# Patient Record
Sex: Female | Born: 1950 | Race: Black or African American | Hispanic: No
Health system: Southern US, Community
[De-identification: ages and names within clinical notes are randomized; demographics above are authoritative.]

---

## 2016-04-18 ENCOUNTER — Encounter (HOSPITAL_COMMUNITY): Payer: Self-pay | Admitting: *Deleted

## 2016-04-18 ENCOUNTER — Emergency Department (HOSPITAL_COMMUNITY)
Admission: EM | Admit: 2016-04-18 | Discharge: 2016-04-18 | Disposition: A | Discharge status: Home / Self Care | Payer: Worker's Compensation | Attending: Emergency Medicine | Admitting: Emergency Medicine

## 2016-04-18 ENCOUNTER — Emergency Department (HOSPITAL_COMMUNITY): Payer: Worker's Compensation

## 2016-04-18 DIAGNOSIS — Z792 Long term (current) use of antibiotics: Secondary | ICD-10-CM | POA: Diagnosis not present

## 2016-04-18 DIAGNOSIS — S3991XA Unspecified injury of abdomen, initial encounter: Secondary | ICD-10-CM | POA: Diagnosis present

## 2016-04-18 DIAGNOSIS — Y998 Other external cause status: Secondary | ICD-10-CM | POA: Diagnosis not present

## 2016-04-18 DIAGNOSIS — I6502 Occlusion and stenosis of left vertebral artery: Secondary | ICD-10-CM | POA: Insufficient documentation

## 2016-04-18 DIAGNOSIS — S301XXA Contusion of abdominal wall, initial encounter: Secondary | ICD-10-CM | POA: Diagnosis not present

## 2016-04-18 DIAGNOSIS — Y92410 Unspecified street and highway as the place of occurrence of the external cause: Secondary | ICD-10-CM | POA: Insufficient documentation

## 2016-04-18 DIAGNOSIS — Z79899 Other long term (current) drug therapy: Secondary | ICD-10-CM | POA: Diagnosis not present

## 2016-04-18 DIAGNOSIS — Z3202 Encounter for pregnancy test, result negative: Secondary | ICD-10-CM | POA: Insufficient documentation

## 2016-04-18 DIAGNOSIS — Y9389 Activity, other specified: Secondary | ICD-10-CM | POA: Diagnosis not present

## 2016-04-18 DIAGNOSIS — T1490XA Injury, unspecified, initial encounter: Secondary | ICD-10-CM

## 2016-04-18 LAB — CBC WITH DIFFERENTIAL/PLATELET
BASOS ABS: 0 10*3/uL (ref 0.0–0.1)
BASOS PCT: 0 %
EOS PCT: 0 %
Eosinophils Absolute: 0 10*3/uL (ref 0.0–0.7)
HCT: 41.1 % (ref 36.0–46.0)
Hemoglobin: 13.9 g/dL (ref 12.0–15.0)
LYMPHS PCT: 14 %
Lymphs Abs: 1.3 10*3/uL (ref 0.7–4.0)
MCH: 33.2 pg (ref 26.0–34.0)
MCHC: 33.8 g/dL (ref 30.0–36.0)
MCV: 98.1 fL (ref 78.0–100.0)
MONO ABS: 0.5 10*3/uL (ref 0.1–1.0)
Monocytes Relative: 6 %
NEUTROS ABS: 7.6 10*3/uL (ref 1.7–7.7)
Neutrophils Relative %: 80 %
PLATELETS: 232 10*3/uL (ref 150–400)
RBC: 4.19 MIL/uL (ref 3.87–5.11)
RDW: 11.9 % (ref 11.5–15.5)
WBC: 9.4 10*3/uL (ref 4.0–10.5)

## 2016-04-18 LAB — COMPREHENSIVE METABOLIC PANEL
ALBUMIN: 4 g/dL (ref 3.5–5.0)
ALK PHOS: 74 U/L (ref 38–126)
ALT: 39 U/L (ref 14–54)
ANION GAP: 9 (ref 5–15)
AST: 47 U/L — ABNORMAL HIGH (ref 15–41)
BILIRUBIN TOTAL: 0.4 mg/dL (ref 0.3–1.2)
BUN: 13 mg/dL (ref 6–20)
CALCIUM: 9.3 mg/dL (ref 8.9–10.3)
CO2: 26 mmol/L (ref 22–32)
Chloride: 107 mmol/L (ref 101–111)
Creatinine, Ser: 0.8 mg/dL (ref 0.44–1.00)
GFR calc Af Amer: >60 mL/min (ref >60)
Glucose, Bld: 103 mg/dL — ABNORMAL HIGH (ref 65–99)
POTASSIUM: 3.9 mmol/L (ref 3.5–5.1)
Sodium: 142 mmol/L (ref 135–145)
TOTAL PROTEIN: 7.4 g/dL (ref 6.5–8.1)

## 2016-04-18 LAB — URINE MICROSCOPIC-ADD ON

## 2016-04-18 LAB — URINALYSIS, ROUTINE W REFLEX MICROSCOPIC
BILIRUBIN URINE: NEGATIVE
Glucose, UA: NEGATIVE mg/dL
Hgb urine dipstick: NEGATIVE
KETONES UR: NEGATIVE mg/dL
NITRITE: NEGATIVE
PROTEIN: NEGATIVE mg/dL
Specific Gravity, Urine: 1.015 (ref 1.005–1.030)
pH: 6.5 (ref 5.0–8.0)

## 2016-04-18 LAB — I-STAT CHEM 8, ED
BUN: 16 mg/dL (ref 6–20)
CALCIUM ION: 1.11 mmol/L — AB (ref 1.13–1.30)
Chloride: 103 mmol/L (ref 101–111)
Creatinine, Ser: 0.7 mg/dL (ref 0.44–1.00)
Glucose, Bld: 97 mg/dL (ref 65–99)
HEMATOCRIT: 45 % (ref 36.0–46.0)
HEMOGLOBIN: 15.3 g/dL — AB (ref 12.0–15.0)
Potassium: 3.7 mmol/L (ref 3.5–5.1)
SODIUM: 141 mmol/L (ref 135–145)
TCO2: 25 mmol/L (ref 0–100)

## 2016-04-18 LAB — I-STAT BETA HCG BLOOD, ED (MC, WL, AP ONLY): I-stat hCG, quantitative: <5 m[IU]/mL (ref <5)
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
## 2016-04-18 MED ORDER — METHOCARBAMOL 500 MG PO TABS: 500.0000 mg | ORAL_TABLET | Freq: Two times a day (BID) | ORAL | Status: AC

## 2016-04-18 MED ORDER — MORPHINE SULFATE (PF) 4 MG/ML IV SOLN
4.0000 mg | Freq: Once | INTRAVENOUS | Status: AC
  Administered 2016-04-18: 4 mg via INTRAVENOUS
  Filled 2016-04-18: qty 1

## 2016-04-18 MED ORDER — NAPROXEN 500 MG PO TABS: 500.0000 mg | ORAL_TABLET | Freq: Two times a day (BID) | ORAL | Status: AC

## 2016-04-18 MED ORDER — IOPAMIDOL (ISOVUE-370) INJECTION 76%
INTRAVENOUS | Status: AC
  Administered 2016-04-18: 100 mL
  Filled 2016-04-18: qty 100

## 2016-04-18 MED ORDER — KETOROLAC TROMETHAMINE 30 MG/ML IJ SOLN
30.0000 mg | Freq: Once | INTRAMUSCULAR | Status: AC
  Administered 2016-04-18: 30 mg via INTRAVENOUS
  Filled 2016-04-18: qty 1

## 2016-04-18 MED ORDER — ONDANSETRON 4 MG PO TBDP
8.0000 mg | ORAL_TABLET | Freq: Once | ORAL | Status: AC
  Administered 2016-04-18: 8 mg via ORAL
  Filled 2016-04-18: qty 2

## 2016-04-18 MED ORDER — HYDROCODONE-ACETAMINOPHEN 5-325 MG PO TABS: 1.0000 | ORAL_TABLET | ORAL | Status: AC | PRN

## 2016-04-18 MED ORDER — ASPIRIN 325 MG PO TABS
325.0000 mg | ORAL_TABLET | Freq: Once | ORAL | Status: AC
  Administered 2016-04-18: 325 mg via ORAL
  Filled 2016-04-18: qty 1
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
## 2016-04-18 NOTE — ED Notes (Signed)
Phlebotomy and [employer] at bedside.
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
